# Patient Record
Sex: Male | Born: 1980 | Hispanic: No | Marital: Married | Smoking: Never smoker
Health system: Southern US, Community
[De-identification: ages and names within clinical notes are randomized; demographics above are authoritative.]

---

## 2014-05-24 ENCOUNTER — Emergency Department (HOSPITAL_COMMUNITY)
Admission: EM | Admit: 2014-05-24 | Discharge: 2014-05-24 | Disposition: A | Discharge status: Home / Self Care | Payer: BC Managed Care – PPO | Attending: Emergency Medicine | Admitting: Emergency Medicine

## 2014-05-24 ENCOUNTER — Encounter (HOSPITAL_COMMUNITY): Payer: Self-pay | Admitting: *Deleted

## 2014-05-24 ENCOUNTER — Emergency Department (HOSPITAL_COMMUNITY): Payer: BC Managed Care – PPO

## 2014-05-24 DIAGNOSIS — Y92312 Tennis court as the place of occurrence of the external cause: Secondary | ICD-10-CM | POA: Diagnosis not present

## 2014-05-24 DIAGNOSIS — S86001A Unspecified injury of right Achilles tendon, initial encounter: Secondary | ICD-10-CM | POA: Diagnosis not present

## 2014-05-24 DIAGNOSIS — Y9389 Activity, other specified: Secondary | ICD-10-CM | POA: Diagnosis not present

## 2014-05-24 DIAGNOSIS — W1839XA Other fall on same level, initial encounter: Secondary | ICD-10-CM | POA: Diagnosis not present

## 2014-05-24 DIAGNOSIS — W19XXXA Unspecified fall, initial encounter: Secondary | ICD-10-CM

## 2014-05-24 DIAGNOSIS — S99911A Unspecified injury of right ankle, initial encounter: Secondary | ICD-10-CM | POA: Diagnosis present

## 2014-05-24 DIAGNOSIS — S93401A Sprain of unspecified ligament of right ankle, initial encounter: Secondary | ICD-10-CM

## 2014-05-24 DIAGNOSIS — Y998 Other external cause status: Secondary | ICD-10-CM | POA: Diagnosis not present

## 2014-05-24 MED ORDER — HYDROCODONE-ACETAMINOPHEN 5-325 MG PO TABS: 1.0000 | ORAL_TABLET | Freq: Four times a day (QID) | ORAL | Status: AC | PRN

## 2014-05-24 MED ORDER — HYDROCODONE-ACETAMINOPHEN 5-325 MG PO TABS
2.0000 | ORAL_TABLET | Freq: Once | ORAL | Status: AC
  Administered 2014-05-24: 2 via ORAL
  Filled 2014-05-24: qty 2

## 2014-05-24 NOTE — ED Notes (Signed)
Ortho at the bedside.

## 2014-05-24 NOTE — Discharge Instructions (Signed)
Please follow up with an orthopedic surgeon for a possible Partial (Incomplete) Achilles Tendon Rupture and for your ankle sprain. Report to your closest emergency room if you develop any worsening of your symptoms, severe pain, severe swelling, redness, warmth, high fever, numbness, weakness or loss of sensation.   An Achilles tendon rupture is an injury in which the tough, cord-like band that attaches the lower muscles of your leg to your heel (Achilles tendon) tears (ruptures). In a partial Achilles tendon rupture, surgery may not be needed. CAUSES  A tendon may rupture if it is weakened or weakening (degenerative) and a sudden stress is applied to it. Weakening or degeneration of a tendon may be caused by:  Recurrent injuries, such as those causing Achilles tendonitis.  Damaged tendons.  Aging.  Vascular disease of the tendon. SIGNS AND SYMPTOMS  Feeling as if you were struck violently in the back of the ankle.  Hearing a "pop" and experiencing severe, sudden (acute) pain; however, the absence of pain does not mean there was not a rupture. DIAGNOSIS A physical exam is usually all that is needed to diagnose an Achilles tendon rupture. During the exam, your health care provider will touch the tendon and the structures around it. You may be asked to lie on your stomach or kneel on a chair while your health care provider squeezes your calf muscle. You most likely have a ruptured tendon if your foot does not flex.  Sometimes tests are performed. These may include:  Ultrasonography. This allows quick confirmation of the diagnosis.  An X-ray exam.  An MRI. TREATMENT  Treatment consists of:  Ice applied to the area.  Pain relieving medicines.  Rest.  Crutches.  Keeping the ankle from moving (immobilization), usually with asplint, for 6-10 weeks. If the injury does not improve within 3-6 months, you may need to go to a specialized runners' clinic or see a sports medicine  specialist, physical therapist, or orthopedic surgeon. HOME CARE INSTRUCTIONS   Apply ice to the injured area:   Put ice in a plastic bag.  Place a towel between your skin and the bag.  Leave the ice on for 20 minutes, 2-3 times a day.  Use crutches and move about only as instructed by your health care provider.  Keep the leg elevated above the level of the heart (the center of the chest) at all times when not using the bathroom. Do not dangle the leg over a chair, couch, or bed. When lying down, elevate your leg on a few pillows. Elevation prevents swelling and reduces pain.  Avoid use other than gentle range of motion of the toes while the tendon is painful.  Do not drive a car until your health care provider specifically tells you it is safe to do so.  Take all medicines as directed by your health care provider.  Keep all follow-up visits with your health care provider. SEEK MEDICAL CARE IF:   Your pain and swelling increase or pain is uncontrolled with medicines.   You develop new, unexplained symptoms or your symptoms get worse.   You cannot move your toes or foot.  You develop warmth and swelling in your foot.  You have an unexplained fever.  MAKE SURE YOU:   Understand these instructions.  Will watch your condition.  Will get help right away if you are not doing well or get worse. Document Released: 02/23/2005 Document Revised: 09/30/2013 Document Reviewed: 01/04/2013 [facility] Patient Information 2015 [employer] [place]. This information is not  intended to replace advice given to you by your health care provider. Make sure you discuss any questions you have with your health care provider.  Ankle Sprain An ankle sprain is an injury to the strong, fibrous tissues (ligaments) that hold the bones of your ankle joint together.  CAUSES An ankle sprain is usually caused by a fall or by twisting your ankle. Ankle sprains most commonly occur when you step on the outer  edge of your foot, and your ankle turns inward. People who participate in sports are more prone to these types of injuries.  SYMPTOMS   Pain in your ankle. The pain may be present at rest or only when you are trying to stand or walk.  Swelling.  Bruising. Bruising may develop immediately or within 1 to 2 days after your injury.  Difficulty standing or walking, particularly when turning corners or changing directions. DIAGNOSIS  Your caregiver will ask you details about your injury and perform a physical exam of your ankle to determine if you have an ankle sprain. During the physical exam, your caregiver will press on and apply pressure to specific areas of your foot and ankle. Your caregiver will try to move your ankle in certain ways. An X-ray exam may be done to be sure a bone was not broken or a ligament did not separate from one of the bones in your ankle (avulsion fracture).  TREATMENT  Certain types of braces can help stabilize your ankle. Your caregiver can make a recommendation for this. Your caregiver may recommend the use of medicine for pain. If your sprain is severe, your caregiver may refer you to a surgeon who helps to restore function to parts of your skeletal system (orthopedist) or a physical therapist. HOME CARE INSTRUCTIONS   Apply ice to your injury for 1-2 days or as directed by your caregiver. Applying ice helps to reduce inflammation and pain.  Put ice in a plastic bag.  Place a towel between your skin and the bag.  Leave the ice on for 15-20 minutes at a time, every 2 hours while you are awake.  Only take over-the-counter or prescription medicines for pain, discomfort, or fever as directed by your caregiver.  Elevate your injured ankle above the level of your heart as much as possible for 2-3 days.  If your caregiver recommends crutches, use them as instructed. Gradually put weight on the affected ankle. Continue to use crutches or a cane until you can walk without  feeling pain in your ankle.  If you have a plaster splint, wear the splint as directed by your caregiver. Do not rest it on anything harder than a pillow for the first 24 hours. Do not put weight on it. Do not get it wet. You may take it off to take a shower or bath.  You may have been given an elastic bandage to wear around your ankle to provide support. If the elastic bandage is too tight (you have numbness or tingling in your foot or your foot becomes cold and blue), adjust the bandage to make it comfortable.  If you have an air splint, you may blow more air into it or let air out to make it more comfortable. You may take your splint off at night and before taking a shower or bath. Wiggle your toes in the splint several times per day to decrease swelling. SEEK MEDICAL CARE IF:   You have rapidly increasing bruising or swelling.  Your toes feel extremely cold  or you lose feeling in your foot.  Your pain is not relieved with medicine. SEEK IMMEDIATE MEDICAL CARE IF:  Your toes are numb or blue.  You have severe pain that is increasing. MAKE SURE YOU:   Understand these instructions.  Will watch your condition.  Will get help right away if you are not doing well or get worse. Document Released: 05/16/2005 Document Revised: 02/08/2012 Document Reviewed: 05/28/2011 [facility] Patient Information 2015 [place], [place]. This information is not intended to replace advice given to you by your health care provider. Make sure you discuss any questions you have with your health care provider.

## 2014-05-24 NOTE — Progress Notes (Signed)
Orthopedic Tech Progress Note Patient Details:  Dakota FrockReegal S Underwood 06/23/1980 [id number]  Ortho Devices Type of Ortho Device: Ace wrap, Post (short leg) splint Ortho Device/Splint Location: RLE Ortho Device/Splint Interventions: Ordered, Application   Jennye MoccasinHughes, Herman Fiero Craig 05/24/2014, 9:03 PM

## 2014-05-24 NOTE — ED Provider Notes (Signed)
CSN: [id number]     Arrival date & time 05/24/14  1648 History  This chart was scribed for non-physician practitioner working with No att. providers found by Elveria Risingimelie Horne, ED Scribe. This patient was seen in room TR06C/TR06C and the patient's care was started at 6:46 PM.    Chief Complaint  Patient presents with  . Fall   The history is provided by the patient. No language interpreter was used.   HPI Comments: Dakota Underwood is a 33 y.o. male who presents to the Emergency Department complaining of right ankle pain after 8-10 foot fall. Patient reports jumping from a fence at the tennis court, landing on the court. Patient's friend witnessed the fall and states that the patient was conscious and diaphoretic. Patient reports mild nausea immediately after the incident which subsided and says that it took him a few minutes before he was able to get up and walk. Patient reports injury to his right ankle only; denies pain in his left ankle. Patient locates pain at his right medial malleolus extendning horizontally across his heel/Achilles to his lateral malleolus.  Patient is from [place] and returns tomorrow morning. Nursing notes reports a questionable loss of consciousness, with discussing this with patient he states that he feels "stunned" after hitting the ground. He states he can recall the entire event, did not hit his head or pass out. Patient states the only traumatic injury was his ankle, and is denying loss of consciousness to me.  History reviewed. No pertinent past medical history. History reviewed. No pertinent past surgical history. History reviewed. No pertinent family history. History  Substance Use Topics  . Smoking status: Never Smoker   . Smokeless tobacco: Never Used  . Alcohol Use: No    Review of Systems  Constitutional: Negative for fever and chills.  Musculoskeletal: Positive for joint swelling and arthralgias.  Skin: Negative for wound.  Neurological: Negative for  weakness and numbness.   Allergies  Review of patient's allergies indicates no known allergies.  Home Medications   Prior to Admission medications   Medication Sig Start Date End Date Taking? Authorizing Provider  HYDROcodone-acetaminophen (NORCO/VICODIN) 5-325 MG per tablet Take 1-2 tablets by mouth every 6 (six) hours as needed for moderate pain or severe pain. 05/24/14   Monte FantasiaJoseph W Himmat Enberg, PA-C   Triage Vitals: BP 117/70 mmHg  Pulse 78  Temp(Src) 98.7 F (37.1 C) (Oral)  Ht 5\' 7"  (1.702 m)  Wt 150 lb (68.04 kg)  BMI 23.49 kg/m2  SpO2 98% Physical Exam  Constitutional: He is oriented to person, place, and time. He appears well-developed and well-nourished. No distress.  HENT:  Head: Normocephalic and atraumatic.  Eyes: EOM are normal.  Neck: Neck supple. No tracheal deviation present.  Cardiovascular: Normal rate.   Pulmonary/Chest: Effort normal. No respiratory distress.  Musculoskeletal: He exhibits tenderness.  Mild swelling to his right lateral malleolar region. No obvious deformity, ecchymosis, erythema. DP pulse 2+. Distal sensation intact. Patient has full active and passive range of motion of his ankle with motor strength 5 out of 5 at hip, knee, ankle. With patient placed prone on the exam table and feet hanging off of the end of the table, bilateral pressure on patient's gastroc produces adequate plantar flexion of feet bilaterally. Patient has tenderness more significant in his Achilles tendon with Achilles tendon firm, and without obvious signs of deformity, injury or weakness.  Neurological: He is alert and oriented to person, place, and time.  Skin: Skin is warm and  dry.  Psychiatric: He has a normal mood and affect. His behavior is normal.  Nursing note and vitals reviewed.   ED Course  Procedures (including critical care time)  COORDINATION OF CARE: 6:52 PM- Awaiting imaging. Discussed treatment plan with patient at bedside and patient agreed to plan.   Labs  Review Labs Reviewed - No data to display  Imaging Review Dg Ankle Complete Right  05/24/2014   CLINICAL DATA:  Fall off fence today. Right ankle injury with lateral and posterior pain.  EXAM: RIGHT ANKLE - COMPLETE 3+ VIEW  COMPARISON:  None.  FINDINGS: Lateral soft tissue swelling. No underlying bony abnormality. No fracture, subluxation or dislocation. Joint spaces are maintained.  IMPRESSION: Lateral soft tissue swelling.  No acute bony abnormality.   Electronically Signed   By: Charlett NoseKevin  Dover M.D.   On: 05/24/2014 19:49     EKG Interpretation None      MDM   Final diagnoses:  Ankle sprain, right, initial encounter  Achilles tendon injury, right, initial encounter    Patient has ankle pain more significant in his Achilles tendon on exam after jumping from a fence which was 10 feet tall, and landing on his feet. Patient denies hearing any snapping or popping. Patient neurovascularly intact with full active and passive range of motion of his ankle, however patient's pain is more impressive than his distal Achilles tendon than his lateral or medial ankle.  Ankle radiographs with impression of some soft tissue swelling with no acute bony abnormality.   Thompson's test negative. Patient does have some mild swelling of his lateral malleolus which could represent a possible ankle sprain in addition. Patient to be splinted with foot in slight plantar flexion. [place] and going back to his hometown tomorrow. Patient placed in posterior splint in case there is a small Achilles tendon tear or rupture. I strongly recommended patient follow-up with an orthopedic specialist in his area on Monday, and discussed the dangers of not receiving orthopedic care should there be an Achilles tendon tear or rupture present.. I discussed return precautions with patient, and patient was agreeable to this plan. I encouraged patient call or return to the ER should he have any questions or  concerns.  I personally performed the services described in this documentation, which was scribed in my presence. The recorded information has been reviewed and is accurate.  BP 107/61 mmHg  Pulse 72  Temp(Src) 98.6 F (37 C) (Oral)  Resp 18  Ht 5\' 7"  (1.702 m)  Wt 150 lb (68.04 kg)  BMI 23.49 kg/m2  SpO2 100%  Signed,  Ladona MowJoe Jonathon Castelo, PA-C 3:39 AM    Monte FantasiaJoseph W Terrea Bruster, PA-C 05/25/14 [id number]  Linwood DibblesJon Knapp, MD 05/27/14 1010

## 2014-05-24 NOTE — ED Notes (Signed)
PA called Ortho Tech.

## 2014-05-24 NOTE — ED Notes (Signed)
States jumped off a tennis court fence (approx 10 ft) and landed on the tennis court. Denies hitting his head but he and his friends report a loc. Friends also state he did not hit his head. Web designererl. His friends states he did not move for a for a few seconds and began sweating. He states he felt very nauseated when he came to and took a few minutes to be able to get up. His only complaint at present is pain in his right ankle. States he is unablle to bear wt.

## 2015-10-30 IMAGING — DX DG ANKLE COMPLETE 3+V*R*
3 series · 3 of 3 positions shown · non-contrast
Comparison: None.

CLINICAL DATA: Fall off fence today. Right ankle injury with
lateral and posterior pain.

EXAM:
RIGHT ANKLE - COMPLETE 3+ VIEW

[ankle ap]
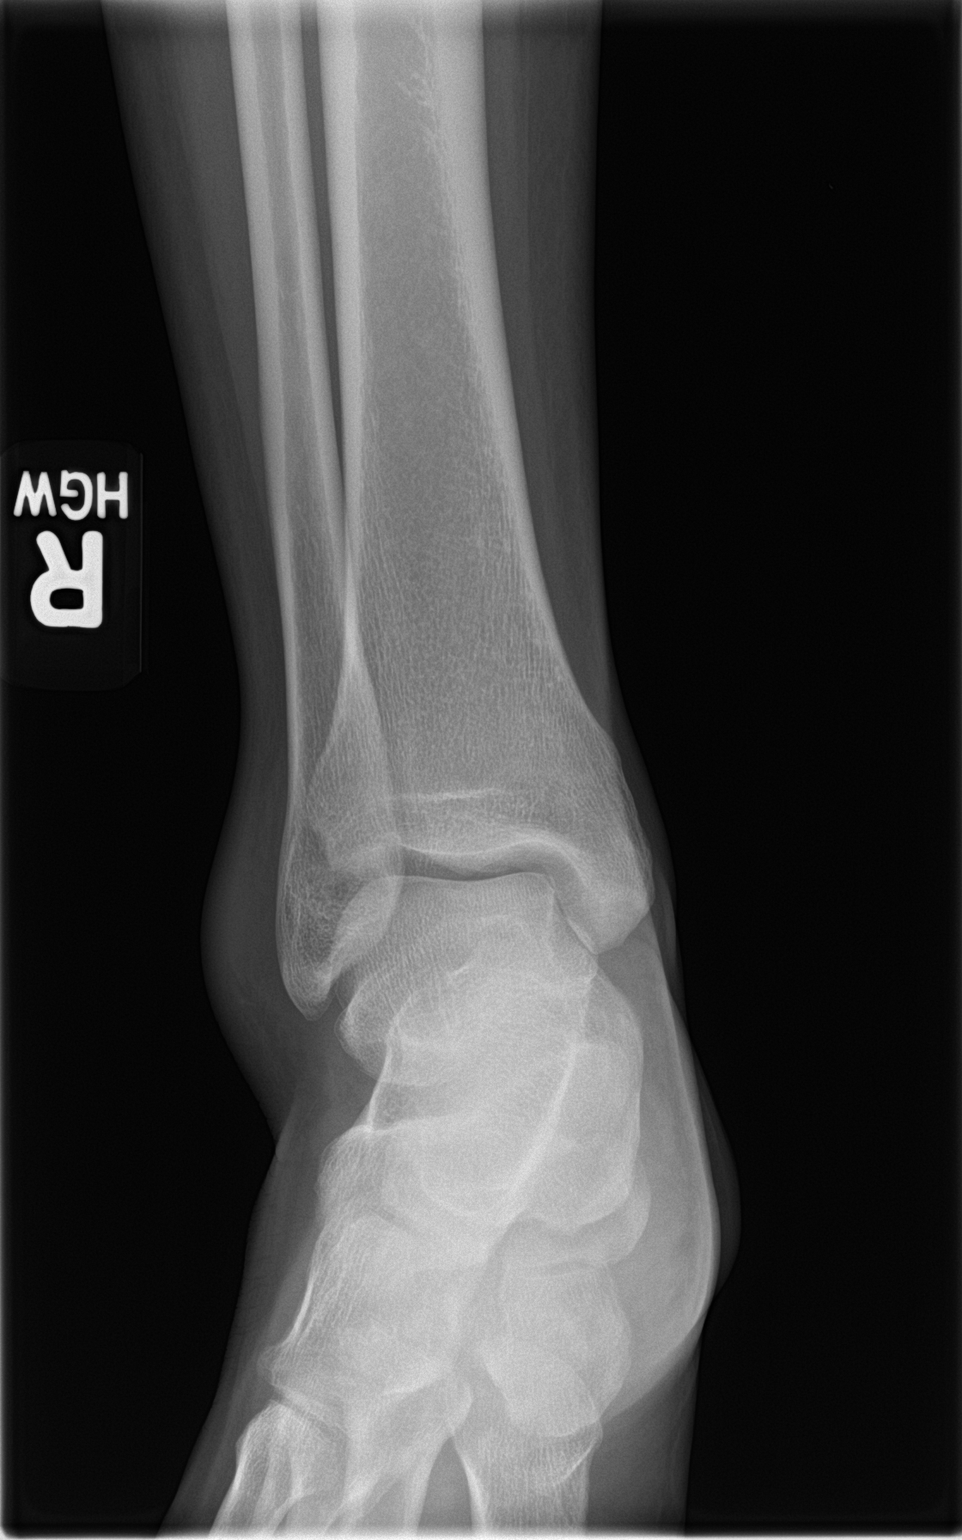

[ankle obl]
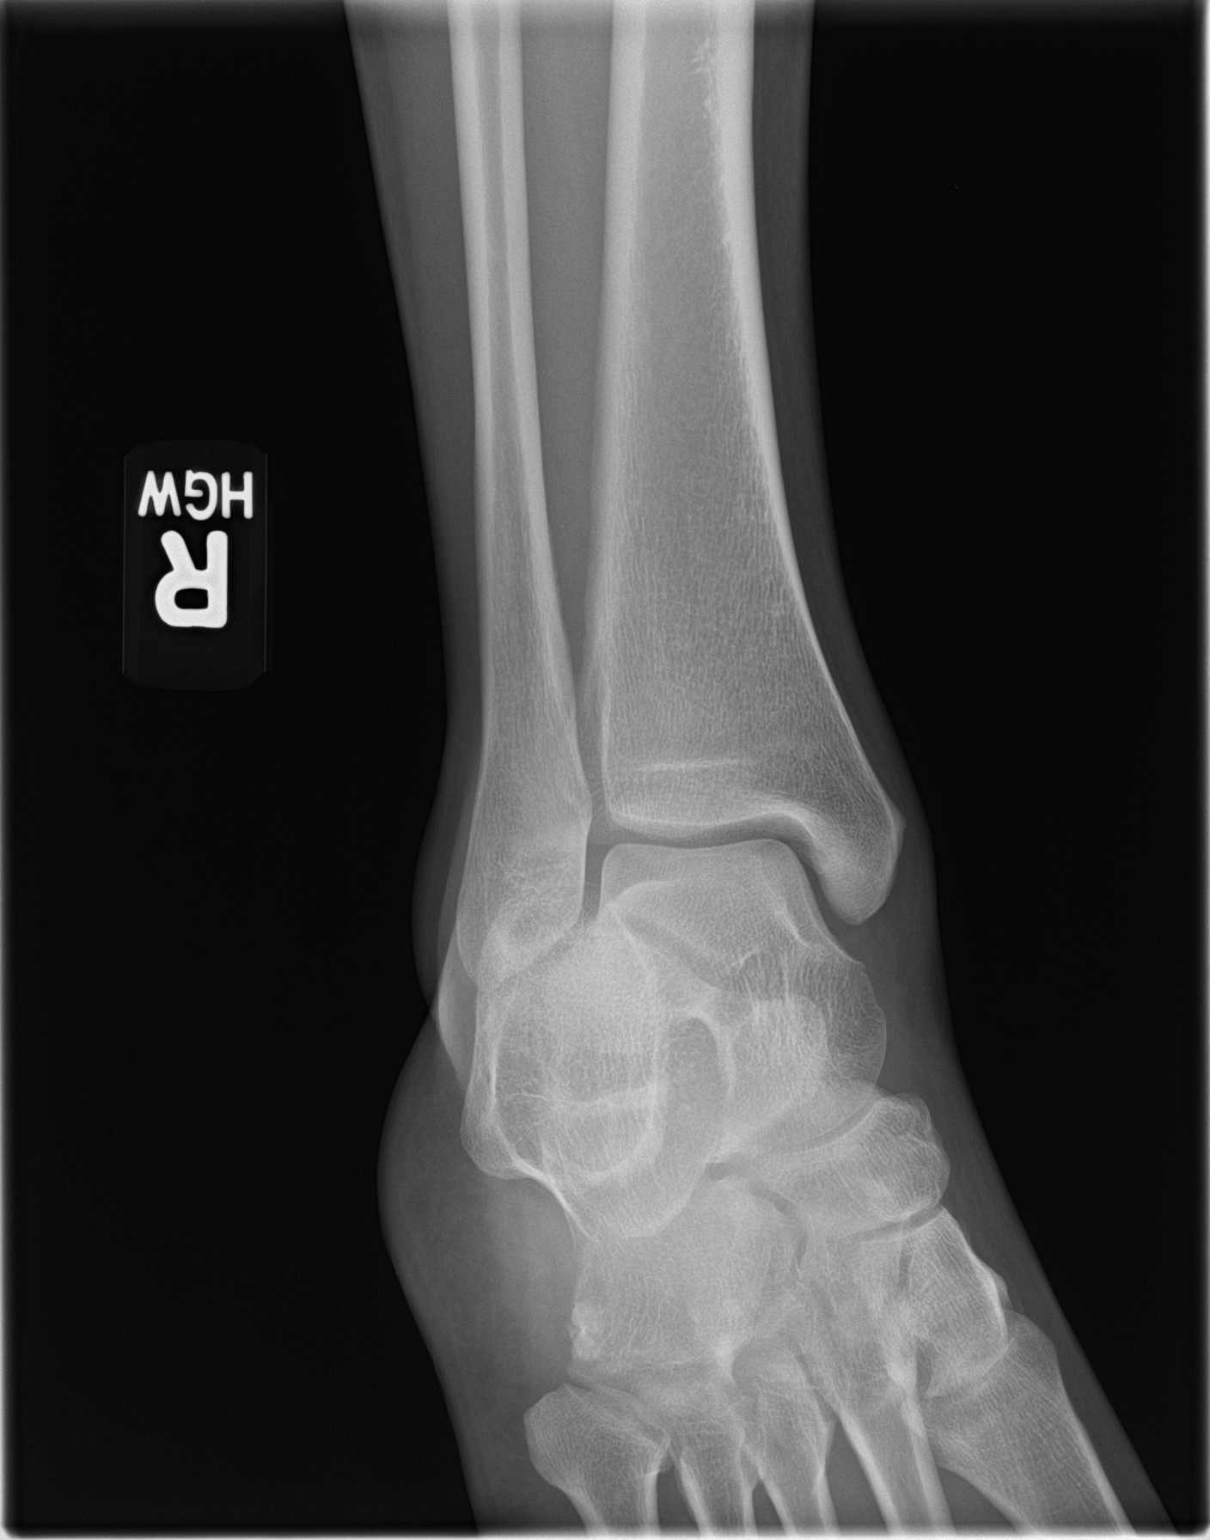

[ankle lat]
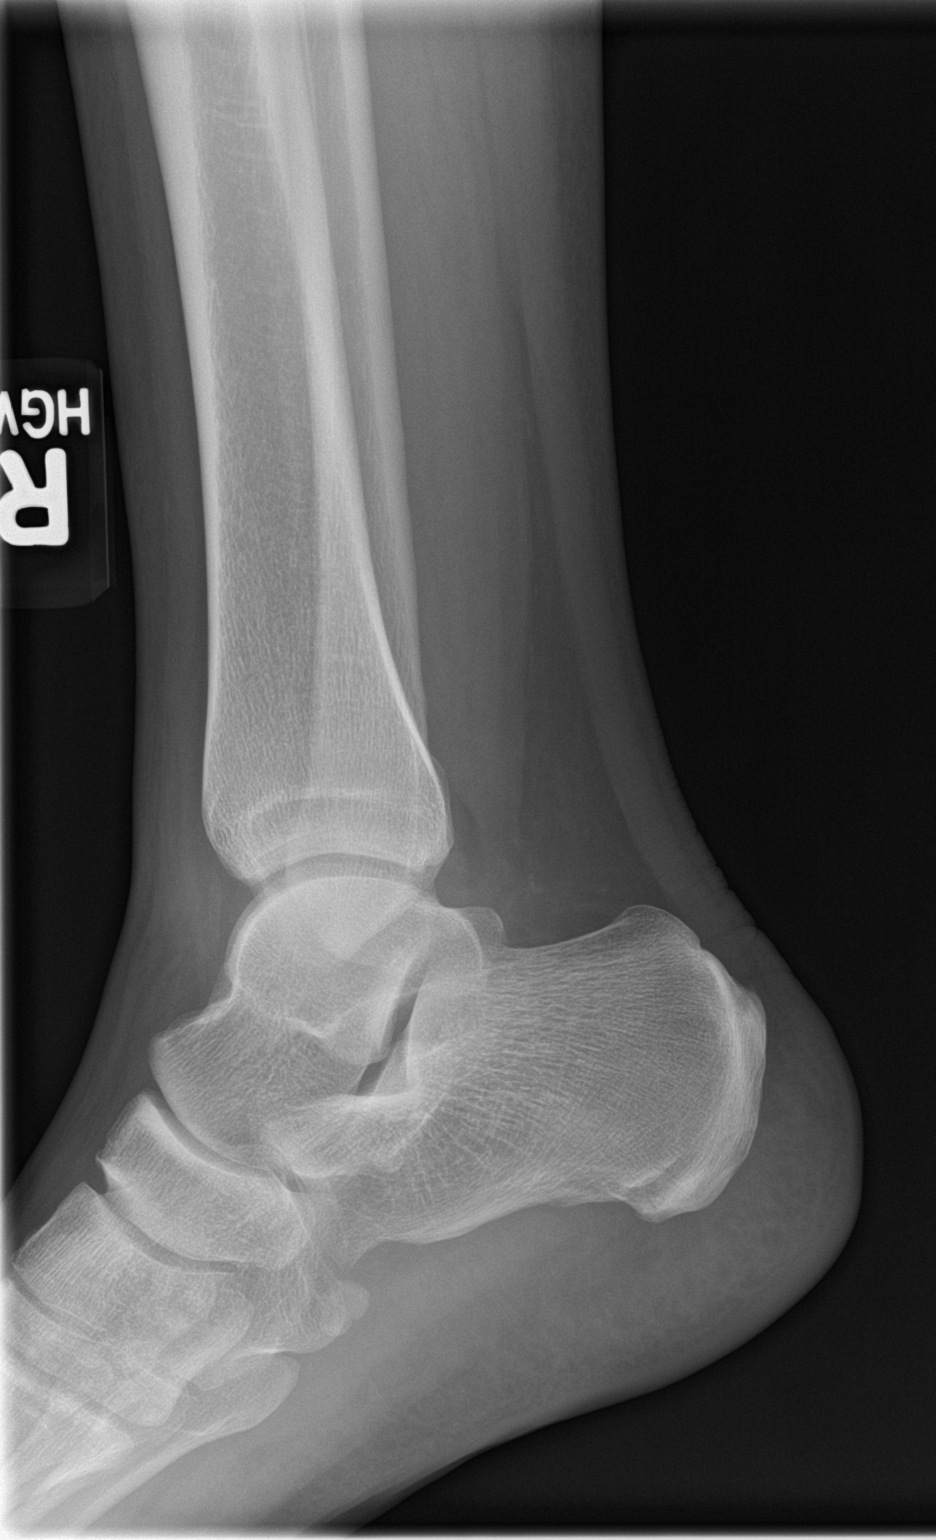

[3 of 3 positions shown; findings below may reference images not displayed]

FINDINGS: Lateral soft tissue swelling. No underlying bony abnormality. No
fracture, subluxation or dislocation. Joint spaces are maintained.
IMPRESSION: Lateral soft tissue swelling.  No acute bony abnormality.
